# Patient Record
Sex: Female | Born: 1960 | Race: Black or African American | Hispanic: No | Marital: Single | Smoking: Former smoker
Health system: Southern US, Community
[De-identification: ages and names within clinical notes are randomized; demographics above are authoritative.]

## PROBLEM LIST (undated history)

## (undated) HISTORY — PX: POLYPECTOMY: SHX149

## (undated) HISTORY — PX: COLONOSCOPY: SHX174

---

## 1992-01-03 HISTORY — PX: TUBAL LIGATION: SHX77

## 1997-04-29 ENCOUNTER — Encounter: Admission: RE | Admit: 1997-04-29 | Discharge: 1997-04-29 | Payer: Self-pay | Admitting: Family Medicine

## 1997-04-29 ENCOUNTER — Other Ambulatory Visit: Admission: RE | Admit: 1997-04-29 | Discharge: 1997-04-29 | Payer: Self-pay | Admitting: *Deleted

## 1997-12-07 ENCOUNTER — Encounter: Admission: RE | Admit: 1997-12-07 | Discharge: 1997-12-07 | Payer: Self-pay | Admitting: Sports Medicine

## 1997-12-09 ENCOUNTER — Encounter: Payer: Self-pay | Admitting: Family Medicine

## 1997-12-09 ENCOUNTER — Ambulatory Visit (HOSPITAL_COMMUNITY): Admission: RE | Admit: 1997-12-09 | Discharge: 1997-12-09 | Payer: Self-pay | Admitting: Family Medicine

## 1997-12-11 ENCOUNTER — Encounter: Admission: RE | Admit: 1997-12-11 | Discharge: 1997-12-11 | Payer: Self-pay | Admitting: Family Medicine

## 1998-01-06 ENCOUNTER — Encounter: Admission: RE | Admit: 1998-01-06 | Discharge: 1998-01-06 | Payer: Self-pay | Admitting: Family Medicine

## 1998-04-07 ENCOUNTER — Encounter: Admission: RE | Admit: 1998-04-07 | Discharge: 1998-04-07 | Payer: Self-pay | Admitting: Family Medicine

## 1998-04-14 ENCOUNTER — Encounter: Admission: RE | Admit: 1998-04-14 | Discharge: 1998-04-14 | Payer: Self-pay | Admitting: Family Medicine

## 1999-02-07 ENCOUNTER — Encounter: Admission: RE | Admit: 1999-02-07 | Discharge: 1999-02-07 | Payer: Self-pay | Admitting: Family Medicine

## 2000-03-15 ENCOUNTER — Encounter: Admission: RE | Admit: 2000-03-15 | Discharge: 2000-03-15 | Payer: Self-pay | Admitting: Sports Medicine

## 2000-07-02 ENCOUNTER — Encounter (INDEPENDENT_AMBULATORY_CARE_PROVIDER_SITE_OTHER): Payer: Self-pay | Admitting: *Deleted

## 2000-07-02 LAB — CONVERTED CEMR LAB

## 2000-07-31 ENCOUNTER — Encounter: Admission: RE | Admit: 2000-07-31 | Discharge: 2000-07-31 | Payer: Self-pay | Admitting: Family Medicine

## 2000-07-31 ENCOUNTER — Encounter (INDEPENDENT_AMBULATORY_CARE_PROVIDER_SITE_OTHER): Payer: Self-pay | Admitting: *Deleted

## 2000-11-08 ENCOUNTER — Encounter: Admission: RE | Admit: 2000-11-08 | Discharge: 2000-11-08 | Payer: Self-pay | Admitting: Obstetrics

## 2000-12-20 ENCOUNTER — Encounter: Admission: RE | Admit: 2000-12-20 | Discharge: 2000-12-20 | Payer: Self-pay | Admitting: Obstetrics

## 2001-04-16 ENCOUNTER — Encounter: Admission: RE | Admit: 2001-04-16 | Discharge: 2001-04-16 | Payer: Self-pay | Admitting: *Deleted

## 2001-04-25 ENCOUNTER — Ambulatory Visit (HOSPITAL_COMMUNITY): Admission: RE | Admit: 2001-04-25 | Discharge: 2001-04-25 | Payer: Self-pay

## 2001-09-11 ENCOUNTER — Encounter: Admission: RE | Admit: 2001-09-11 | Discharge: 2001-09-11 | Payer: Self-pay | Admitting: Internal Medicine

## 2001-12-10 ENCOUNTER — Encounter: Admission: RE | Admit: 2001-12-10 | Discharge: 2001-12-10 | Payer: Self-pay | Admitting: Internal Medicine

## 2002-12-03 ENCOUNTER — Encounter: Admission: RE | Admit: 2002-12-03 | Discharge: 2002-12-03 | Payer: Self-pay | Admitting: Internal Medicine

## 2004-01-14 ENCOUNTER — Ambulatory Visit: Payer: Self-pay | Admitting: Internal Medicine

## 2004-02-01 ENCOUNTER — Ambulatory Visit (HOSPITAL_COMMUNITY): Admission: RE | Admit: 2004-02-01 | Discharge: 2004-02-01 | Payer: Self-pay | Admitting: *Deleted

## 2004-02-19 ENCOUNTER — Encounter: Admission: RE | Admit: 2004-02-19 | Discharge: 2004-02-19 | Payer: Self-pay | Admitting: Family Medicine

## 2005-10-18 ENCOUNTER — Encounter: Admission: RE | Admit: 2005-10-18 | Discharge: 2005-10-18 | Payer: Self-pay | Admitting: Sports Medicine

## 2006-03-02 ENCOUNTER — Encounter (INDEPENDENT_AMBULATORY_CARE_PROVIDER_SITE_OTHER): Payer: Self-pay | Admitting: *Deleted

## 2006-07-17 ENCOUNTER — Encounter: Admission: RE | Admit: 2006-07-17 | Discharge: 2006-07-17 | Payer: Self-pay | Admitting: Orthopedic Surgery

## 2006-12-13 ENCOUNTER — Encounter: Admission: RE | Admit: 2006-12-13 | Discharge: 2006-12-13 | Payer: Self-pay | Admitting: Internal Medicine

## 2010-01-23 ENCOUNTER — Encounter: Payer: Self-pay | Admitting: *Deleted

## 2010-05-20 NOTE — Consult Note (Signed)
[place]. [facility]  Patient:    Carolyn Crawford, Carolyn Crawford Visit Number: [id number] MRN: [id number]          Service Type: Attending:  Kristen Loader, M.D. Dictated by:   Kristen Loader, M.D.                            Consultation Report  HISTORY OF PRESENT ILLNESS:  This is a 50 year old black female, para 1-0-0-1, in for a routine GYN physical examination.  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  She has had a history of bilateral tubal ligation.  The patient smokes one pack per two to three days.  Social drinker.  Takes no illicit medicines and works as a Industrial/product designer.  ALLERGIES:  No known drug allergies.  PHYSICAL EXAMINATION:  NECK:  Thyroid examination is symmetrical and no masses.  BREASTS:  No dominant masses.  No nipple discharge.  No axillary nodes noted.  ABDOMEN:  Soft, flat, nontender with no masses and no organomegaly.  PELVIC:  Vulva normal, BUS within normal limits, vagina is clean and well rugated.  The cervix is parous and clean.  The uterus is anterior, normal size, shape, consistency.  Adnexa could not be well palpated.  RECTAL:  Normal.  Hemoccult was negative.  Significant physical examination revealed a tenderness in the right sacroiliac area to palpation.  No CVA tenderness on punch tenderness.  IMPRESSION:  This patient is in good physical health with possible sacroiliac strain.  We have recommended a firm mattress or plywood under her mattress and a pillow between her legs.  If this is not helpful, she can get a referral to the orthopedic clinic.  We want a urinalysis, CBC, and the patient is to start baseline mammogram. Dictated by:   Kristen Loader, M.D. Attending:  Kristen Loader, M.D. DD:  04/16/01 TD:  04/16/01 Job: [id number]

## 2011-01-05 ENCOUNTER — Other Ambulatory Visit: Payer: Self-pay | Admitting: Obstetrics and Gynecology

## 2011-01-05 DIAGNOSIS — R928 Other abnormal and inconclusive findings on diagnostic imaging of breast: Secondary | ICD-10-CM

## 2011-01-13 ENCOUNTER — Ambulatory Visit
Admission: RE | Admit: 2011-01-13 | Discharge: 2011-01-13 | Disposition: A | Discharge status: Home / Self Care | Payer: BC Managed Care – PPO | Source: Ambulatory Visit | Attending: Obstetrics and Gynecology | Admitting: Obstetrics and Gynecology

## 2011-01-13 DIAGNOSIS — R928 Other abnormal and inconclusive findings on diagnostic imaging of breast: Secondary | ICD-10-CM

## 2012-02-29 ENCOUNTER — Encounter: Payer: Self-pay | Admitting: Internal Medicine

## 2012-03-07 ENCOUNTER — Ambulatory Visit (AMBULATORY_SURGERY_CENTER): Payer: BC Managed Care – PPO | Admitting: *Deleted

## 2012-03-07 VITALS — Ht 60.0 in | Wt 187.6 lb

## 2012-03-07 DIAGNOSIS — Z1211 Encounter for screening for malignant neoplasm of colon: Secondary | ICD-10-CM

## 2012-03-07 MED ORDER — MOVIPREP 100 G PO SOLR: ORAL | Status: DC

## 2012-03-11 ENCOUNTER — Encounter: Payer: Self-pay | Admitting: Internal Medicine

## 2012-03-19 ENCOUNTER — Ambulatory Visit (AMBULATORY_SURGERY_CENTER): Payer: BC Managed Care – PPO | Admitting: Internal Medicine

## 2012-03-19 ENCOUNTER — Encounter: Payer: Self-pay | Admitting: Internal Medicine

## 2012-03-19 VITALS — BP 153/85 | HR 76 | Temp 96.8°F | Resp 17 | Ht 60.0 in | Wt 187.0 lb

## 2012-03-19 DIAGNOSIS — D126 Benign neoplasm of colon, unspecified: Secondary | ICD-10-CM

## 2012-03-19 DIAGNOSIS — Z1211 Encounter for screening for malignant neoplasm of colon: Secondary | ICD-10-CM

## 2012-03-19 MED ORDER — SODIUM CHLORIDE 0.9 % IV SOLN: 500.0000 mL | INTRAVENOUS | Status: DC

## 2012-03-19 NOTE — Progress Notes (Signed)
Lidocaine-40mg IV prior to Propofol InductionPropofol given over incremental dosages 

## 2012-03-19 NOTE — Progress Notes (Signed)
Called to room to assist during endoscopic procedure.  Patient ID and intended procedure confirmed with present staff. Received instructions for my participation in the procedure from the performing physician.  

## 2012-03-19 NOTE — Patient Instructions (Addendum)
Discharge instructions given with verbal understanding. Handout on polyps given. Resume previous medications. YOU HAD AN ENDOSCOPIC PROCEDURE TODAY AT THE [facility]: Refer to the procedure report that was given to you for any specific questions about what was found during the examination.  If the procedure report does not answer your questions, please call your gastroenterologist to clarify.  If you requested that your care partner not be given the details of your procedure findings, then the procedure report has been included in a sealed envelope for you to review at your convenience later.  YOU SHOULD EXPECT: Some feelings of bloating in the abdomen. Passage of more gas than usual.  Walking can help get rid of the air that was put into your GI tract during the procedure and reduce the bloating. If you had a lower endoscopy (such as a colonoscopy or flexible sigmoidoscopy) you may notice spotting of blood in your stool or on the toilet paper. If you underwent a bowel prep for your procedure, then you may not have a normal bowel movement for a few days.  DIET: Your first meal following the procedure should be a light meal and then it is ok to progress to your normal diet.  A half-sandwich or bowl of soup is an example of a good first meal.  Heavy or fried foods are harder to digest and may make you feel nauseous or bloated.  Likewise meals heavy in dairy and vegetables can cause extra gas to form and this can also increase the bloating.  Drink plenty of fluids but you should avoid alcoholic beverages for 24 hours.  ACTIVITY: Your care partner should take you home directly after the procedure.  You should plan to take it easy, moving slowly for the rest of the day.  You can resume normal activity the day after the procedure however you should NOT DRIVE or use heavy machinery for 24 hours (because of the sedation medicines used during the test).    SYMPTOMS TO REPORT IMMEDIATELY: A  gastroenterologist can be reached at any hour.  During normal business hours, 8:30 AM to 5:00 PM Monday through Friday, call [phone].  After hours and on weekends, please call the GI answering service at [phone] who will take a message and have the physician on call contact you.   Following lower endoscopy (colonoscopy or flexible sigmoidoscopy):  Excessive amounts of blood in the stool  Significant tenderness or worsening of abdominal pains  Swelling of the abdomen that is new, acute  Fever of 100F or higher  FOLLOW UP: If any biopsies were taken you will be contacted by phone or by letter within the next 1-3 weeks.  Call your gastroenterologist if you have not heard about the biopsies in 3 weeks.  Our staff will call the home number listed on your records the next business day following your procedure to check on you and address any questions or concerns that you may have at that time regarding the information given to you following your procedure. This is a courtesy call and so if there is no answer at the home number and we have not heard from you through the emergency physician on call, we will assume that you have returned to your regular daily activities without incident.  SIGNATURES/CONFIDENTIALITY: You and/or your care partner have signed paperwork which will be entered into your electronic medical record.  These signatures attest to the fact that that the information above on your After Visit Summary has   been reviewed and is understood.  Full responsibility of the confidentiality of this discharge information lies with you and/or your care-partner. 

## 2012-03-19 NOTE — Op Note (Signed)
[facility] [address] [place], [address]   COLONOSCOPY PROCEDURE REPORT  PATIENT: Carolyn Crawford, Carolyn Crawford  MR#: [id number] BIRTHDATE: June 12, 1960 , 51  yrs. old GENDER: Female ENDOSCOPIST: Hart Carwin, MD REFERRED BY:  Candice Camp, M.D. PROCEDURE DATE:  03/19/2012 PROCEDURE:   Colonoscopy with cold biopsy polypectomy and Colonoscopy with snare polypectomy ASA CLASS:   Class I INDICATIONS:Average risk patient for colon cancer. MEDICATIONS: MAC sedation, administered by CRNA and propofol (Diprivan) 300mg  IV  DESCRIPTION OF PROCEDURE:   After the risks and benefits and of the procedure were explained, informed consent was obtained.  A digital rectal exam revealed no abnormalities of the rectum.    The LB [id number]  endoscope was introduced through the anus and advanced to the cecum, which was identified by both the appendix and ileocecal valve .  The quality of the prep was good, using MoviPrep .  The instrument was then slowly withdrawn as the colon was fully examined.     COLON FINDINGS: Four smooth sessile polyps ranging between 3-33mm in size were found throughout the entire examined colon., cecum, ascending colon  at 30 cm and at 20 cm  A polypectomy was performed with cold forceps, in the sigmoid colon with a cold snare and using snare cautery.in the right colon  The resection was complete and the polyp tissue was completely retrieved.     Retroflexed views revealed no abnormalities. 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  COMPLICATIONS: There were no complications. ENDOSCOPIC IMPRESSION: Four sessile polyps ranging between 3-70mm in size were found throughout the entire examined colon; polypectomy was performed with cold forceps, with a cold snare and using snare cautery melanosis coli  RECOMMENDATIONS: 1.  Await pathology results 2.  High  fiber diet 3.  stop using herbal laxatives, use fiber, Miralax or stool softeners instead   REPEAT EXAM: for Colonoscopy, pending biopsy results.  cc:  _______________________________ eSignedHart Carwin, MD 03/19/2012 11:17 AM     PATIENT NAME:  [name], [name] MR#: [id number]

## 2012-03-19 NOTE — Progress Notes (Signed)
Patient did not experience any of the following events: a burn prior to discharge; a fall within the facility; wrong site/side/patient/procedure/implant event; or a hospital transfer or hospital admission upon discharge from the facility. (G8907) Patient did not have preoperative order for IV antibiotic SSI prophylaxis. (G8918)  

## 2012-03-20 ENCOUNTER — Telehealth: Payer: Self-pay | Admitting: *Deleted

## 2012-03-20 NOTE — Telephone Encounter (Signed)
  Follow up Call-  Call back number 03/19/2012  Post procedure Call Back phone  # [phone]  Permission to leave phone message Yes     Patient questions:  Do you have a fever, pain , or abdominal swelling? no Pain Score  0 *  Have you tolerated food without any problems? yes  Have you been able to return to your normal activities? yes  Do you have any questions about your discharge instructions: Diet   no Medications  no Follow up visit  no  Do you have questions or concerns about your Care? no  Actions: * If pain score is 4 or above: No action needed, pain <4.

## 2012-03-26 ENCOUNTER — Encounter: Payer: Self-pay | Admitting: Internal Medicine

## 2014-04-28 ENCOUNTER — Other Ambulatory Visit: Payer: Self-pay | Admitting: Obstetrics and Gynecology

## 2014-04-29 LAB — CYTOLOGY - PAP

## 2015-05-11 ENCOUNTER — Other Ambulatory Visit: Payer: Self-pay | Admitting: Obstetrics and Gynecology

## 2015-05-11 DIAGNOSIS — R928 Other abnormal and inconclusive findings on diagnostic imaging of breast: Secondary | ICD-10-CM

## 2015-05-19 ENCOUNTER — Ambulatory Visit
Admission: RE | Admit: 2015-05-19 | Discharge: 2015-05-19 | Disposition: A | Discharge status: Home / Self Care | Payer: BC Managed Care – PPO | Source: Ambulatory Visit | Attending: Obstetrics and Gynecology | Admitting: Obstetrics and Gynecology

## 2015-05-19 DIAGNOSIS — R928 Other abnormal and inconclusive findings on diagnostic imaging of breast: Secondary | ICD-10-CM

## 2015-10-21 ENCOUNTER — Other Ambulatory Visit: Payer: Self-pay | Admitting: Obstetrics and Gynecology

## 2015-10-21 DIAGNOSIS — N63 Unspecified lump in unspecified breast: Secondary | ICD-10-CM

## 2015-11-09 ENCOUNTER — Ambulatory Visit
Admission: RE | Admit: 2015-11-09 | Discharge: 2015-11-09 | Disposition: A | Discharge status: Home / Self Care | Payer: BC Managed Care – PPO | Source: Ambulatory Visit | Attending: Obstetrics and Gynecology | Admitting: Obstetrics and Gynecology

## 2015-11-09 DIAGNOSIS — N63 Unspecified lump in unspecified breast: Secondary | ICD-10-CM

## 2016-03-29 ENCOUNTER — Other Ambulatory Visit: Payer: Self-pay | Admitting: Obstetrics and Gynecology

## 2016-03-29 DIAGNOSIS — N63 Unspecified lump in unspecified breast: Secondary | ICD-10-CM

## 2016-05-10 ENCOUNTER — Other Ambulatory Visit: Payer: Self-pay | Admitting: Obstetrics and Gynecology

## 2016-05-10 ENCOUNTER — Ambulatory Visit
Admission: RE | Admit: 2016-05-10 | Discharge: 2016-05-10 | Disposition: A | Discharge status: Home / Self Care | Payer: BC Managed Care – PPO | Source: Ambulatory Visit | Attending: Obstetrics and Gynecology | Admitting: Obstetrics and Gynecology

## 2016-05-10 DIAGNOSIS — N63 Unspecified lump in unspecified breast: Secondary | ICD-10-CM

## 2017-03-18 ENCOUNTER — Encounter: Payer: Self-pay | Admitting: Gastroenterology

## 2017-03-28 ENCOUNTER — Encounter: Payer: Self-pay | Admitting: Gastroenterology

## 2017-03-28 ENCOUNTER — Other Ambulatory Visit: Payer: Self-pay | Admitting: Obstetrics and Gynecology

## 2017-03-28 DIAGNOSIS — Z1231 Encounter for screening mammogram for malignant neoplasm of breast: Secondary | ICD-10-CM

## 2017-05-15 ENCOUNTER — Ambulatory Visit
Admission: RE | Admit: 2017-05-15 | Discharge: 2017-05-15 | Disposition: A | Discharge status: Home / Self Care | Payer: BC Managed Care – PPO | Source: Ambulatory Visit | Attending: Obstetrics and Gynecology | Admitting: Obstetrics and Gynecology

## 2017-05-15 DIAGNOSIS — Z1231 Encounter for screening mammogram for malignant neoplasm of breast: Secondary | ICD-10-CM

## 2017-05-16 ENCOUNTER — Other Ambulatory Visit: Payer: Self-pay | Admitting: Obstetrics and Gynecology

## 2017-05-16 DIAGNOSIS — R928 Other abnormal and inconclusive findings on diagnostic imaging of breast: Secondary | ICD-10-CM

## 2017-05-23 ENCOUNTER — Ambulatory Visit
Admission: RE | Admit: 2017-05-23 | Discharge: 2017-05-23 | Disposition: A | Discharge status: Home / Self Care | Payer: BC Managed Care – PPO | Source: Ambulatory Visit | Attending: Obstetrics and Gynecology | Admitting: Obstetrics and Gynecology

## 2017-05-23 DIAGNOSIS — R928 Other abnormal and inconclusive findings on diagnostic imaging of breast: Secondary | ICD-10-CM

## 2017-05-29 ENCOUNTER — Ambulatory Visit (AMBULATORY_SURGERY_CENTER): Payer: Self-pay

## 2017-05-29 VITALS — Ht 60.0 in | Wt 190.4 lb

## 2017-05-29 DIAGNOSIS — Z86010 Personal history of colonic polyps: Secondary | ICD-10-CM

## 2017-05-29 MED ORDER — NA SULFATE-K SULFATE-MG SULF 17.5-3.13-1.6 GM/177ML PO SOLN: 1.0000 | Freq: Once | ORAL | 0 refills | Status: AC

## 2017-05-29 NOTE — Progress Notes (Signed)
Per pt, no allergies to soy or egg products.Pt not taking any weight loss meds or using  O2 at home.  Emmi video sent to pt's email. 

## 2017-06-06 ENCOUNTER — Encounter: Payer: Self-pay | Admitting: Gastroenterology

## 2017-06-12 ENCOUNTER — Other Ambulatory Visit: Payer: Self-pay

## 2017-06-12 ENCOUNTER — Ambulatory Visit (AMBULATORY_SURGERY_CENTER): Payer: BC Managed Care – PPO | Admitting: Gastroenterology

## 2017-06-12 ENCOUNTER — Encounter: Payer: Self-pay | Admitting: Gastroenterology

## 2017-06-12 VITALS — BP 118/78 | HR 71 | Temp 98.2°F | Resp 12 | Ht 60.0 in | Wt 190.0 lb

## 2017-06-12 DIAGNOSIS — Z86010 Personal history of colonic polyps: Secondary | ICD-10-CM | POA: Diagnosis not present

## 2017-06-12 DIAGNOSIS — D122 Benign neoplasm of ascending colon: Secondary | ICD-10-CM

## 2017-06-12 DIAGNOSIS — Z1211 Encounter for screening for malignant neoplasm of colon: Secondary | ICD-10-CM | POA: Diagnosis not present

## 2017-06-12 MED ORDER — SODIUM CHLORIDE 0.9 % IV SOLN: 500.0000 mL | Freq: Once | INTRAVENOUS | Status: DC

## 2017-06-12 NOTE — Patient Instructions (Signed)
Please read handouts provided on polyps.  Repeat colonoscopy in 3-5 years based on pathology results.     YOU HAD AN ENDOSCOPIC PROCEDURE TODAY AT [facility]:   Refer to the procedure report that was given to you for any specific questions about what was found during the examination.  If the procedure report does not answer your questions, please call your gastroenterologist to clarify.  If you requested that your care partner not be given the details of your procedure findings, then the procedure report has been included in a sealed envelope for you to review at your convenience later.  YOU SHOULD EXPECT: Some feelings of bloating in the abdomen. Passage of more gas than usual.  Walking can help get rid of the air that was put into your GI tract during the procedure and reduce the bloating. If you had a lower endoscopy (such as a colonoscopy or flexible sigmoidoscopy) you may notice spotting of blood in your stool or on the toilet paper. If you underwent a bowel prep for your procedure, you may not have a normal bowel movement for a few days.  Please Note:  You might notice some irritation and congestion in your nose or some drainage.  This is from the oxygen used during your procedure.  There is no need for concern and it should clear up in a day or so.  SYMPTOMS TO REPORT IMMEDIATELY:   Following lower endoscopy (colonoscopy or flexible sigmoidoscopy):  Excessive amounts of blood in the stool  Significant tenderness or worsening of abdominal pains  Swelling of the abdomen that is new, acute  Fever of 100F or higher    For urgent or emergent issues, a gastroenterologist can be reached at any hour by calling [phone].   DIET:  We do recommend a small meal at first, but then you may proceed to your regular diet.  Drink plenty of fluids but you should avoid alcoholic beverages for 24 hours.  ACTIVITY:  You should plan to take it easy for the rest of today and you  should NOT DRIVE or use heavy machinery until tomorrow (because of the sedation medicines used during the test).    FOLLOW UP: Our staff will call the number listed on your records the next business day following your procedure to check on you and address any questions or concerns that you may have regarding the information given to you following your procedure. If we do not reach you, we will leave a message.  However, if you are feeling well and you are not experiencing any problems, there is no need to return our call.  We will assume that you have returned to your regular daily activities without incident.  If any biopsies were taken you will be contacted by phone or by letter within the next 1-3 weeks.  Please call us at [phone] if you have not heard about the biopsies in 3 weeks.    SIGNATURES/CONFIDENTIALITY: You and/or your care partner have signed paperwork which will be entered into your electronic medical record.  These signatures attest to the fact that that the information above on your After Visit Summary has been reviewed and is understood.  Full responsibility of the confidentiality of this discharge information lies with you and/or your care-partner.

## 2017-06-12 NOTE — Progress Notes (Signed)
Report to PACU, RN, vss, BBS= Clear.  

## 2017-06-12 NOTE — Op Note (Signed)
[place] Patient Name: Carolyn Crawford Procedure Date: 06/12/2017 10:12 AM MRN: [id number] Endoscopist: Mauri Pole , MD Age: 57 Referring MD:  Date of Birth: [birth year] Gender: Female Account #: [id number] Procedure:                Colonoscopy Indications:              High risk colon cancer surveillance: Personal                            history of multiple (3 or more) adenomas, High risk                            colon cancer surveillance: Personal history of                            adenoma less than 10 mm in size Medicines:                Monitored Anesthesia Care Procedure:                Pre-Anesthesia Assessment:                           - Prior to the procedure, a History and Physical                            was performed, and patient medications and                            allergies were reviewed. The patient's tolerance of                            previous anesthesia was also reviewed. The risks                            and benefits of the procedure and the sedation                            options and risks were discussed with the patient.                            All questions were answered, and informed consent                            was obtained. Prior Anticoagulants: The patient has                            taken no previous anticoagulant or antiplatelet                            agents. ASA Grade Assessment: II - A patient with                            mild systemic disease. After reviewing the risks  and benefits, the patient was deemed in                            satisfactory condition to undergo the procedure.                           After obtaining informed consent, the colonoscope                            was passed under direct vision. Throughout the                            procedure, the patient's blood pressure, pulse, and                            oxygen saturations were  monitored continuously. The                            Colonoscope was introduced through the anus and                            advanced to the the cecum, identified by                            appendiceal orifice and ileocecal valve. The                            colonoscopy was performed without difficulty. The                            patient tolerated the procedure well. The quality                            of the bowel preparation was adequate. The                            ileocecal valve, appendiceal orifice, and rectum                            were photographed. Scope In: 10:24:45 AM Scope Out: 10:42:02 AM Scope Withdrawal Time: 0 hours 13 minutes 10 seconds  Total Procedure Duration: 0 hours 17 minutes 17 seconds  Findings:                 The perianal and digital rectal examinations were                            normal.                           A 11 mm polyp was found in the ascending colon. The                            polyp was sessile. The polyp was removed with a  cold snare. Resection and retrieval were complete.                           Non-bleeding internal hemorrhoids were found during                            retroflexion. The hemorrhoids were small.                           The exam was otherwise without abnormality. Complications:            No immediate complications. Estimated Blood Loss:     Estimated blood loss was minimal. Impression:               - One 11 mm polyp in the ascending colon, removed                            with a cold snare. Resected and retrieved.                           - Non-bleeding internal hemorrhoids.                           - The examination was otherwise normal. Recommendation:           - Patient has a contact number available for                            emergencies. The signs and symptoms of potential                            delayed complications were discussed with the                             patient. Return to normal activities tomorrow.                            Written discharge instructions were provided to the                            patient.                           - Resume previous diet.                           - Continue present medications.                           - Await pathology results.                           - Repeat colonoscopy in 3 - 5 years for                            surveillance based on pathology results. Mauri Pole, MD 06/12/2017 10:48:05 AM This report has  been signed electronically.

## 2017-06-12 NOTE — Progress Notes (Signed)
Pt's states no medical or surgical changes since previsit or office visit. 

## 2017-06-12 NOTE — Progress Notes (Signed)
Called to room to assist during endoscopic procedure.  Patient ID and intended procedure confirmed with present staff. Received instructions for my participation in the procedure from the performing physician.  

## 2017-06-13 ENCOUNTER — Telehealth: Payer: Self-pay | Admitting: *Deleted

## 2017-06-13 NOTE — Telephone Encounter (Signed)
No answer for post procedure call back. LEft message and will attempt to call back later this afternoon. SM 

## 2017-06-13 NOTE — Telephone Encounter (Signed)
Left message on f/u call 

## 2017-06-25 ENCOUNTER — Encounter: Payer: Self-pay | Admitting: Gastroenterology

## 2018-07-15 IMAGING — US ULTRASOUND LEFT BREAST LIMITED
1 series · 5 of 5 positions shown · non-contrast
Comparison: 05/15/2017 and prior mammograms

CLINICAL DATA: 56-year-old female for further evaluation of LEFT
breast mass identified on screening mammogram.

EXAM:
ULTRASOUND OF THE LEFT BREAST

[Series 1: ultrasound left breast limited · 0.06mm/px · 5 of 5 slices shown]
[im 1/5]
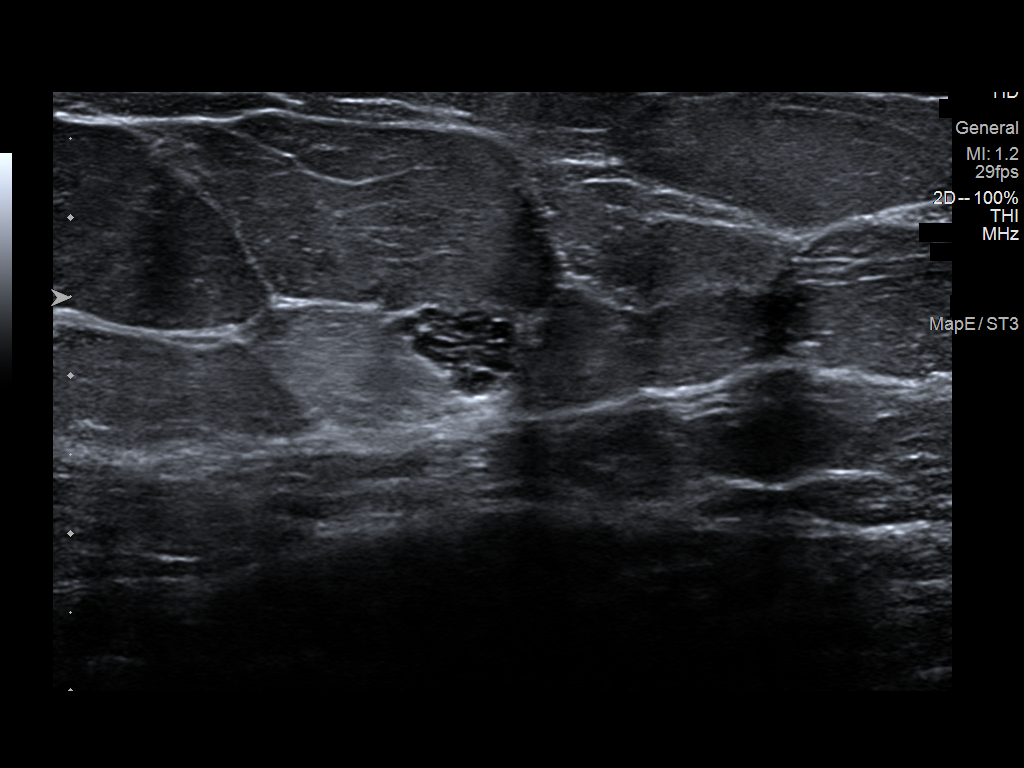
[im 2/5]
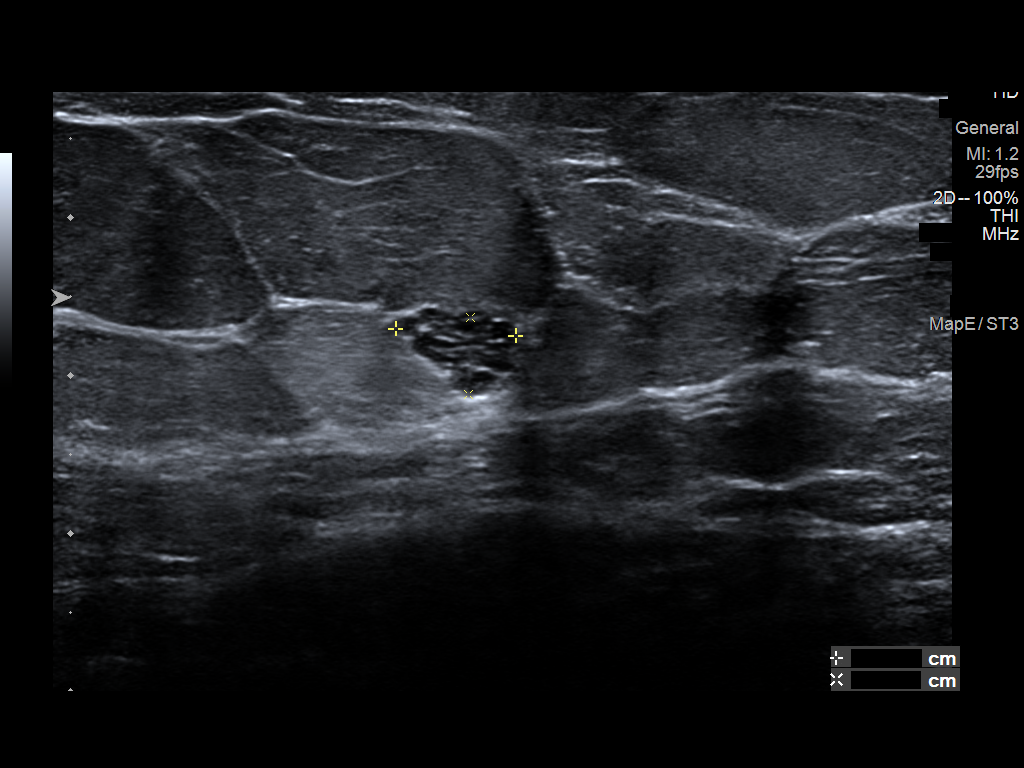
[im 3/5]
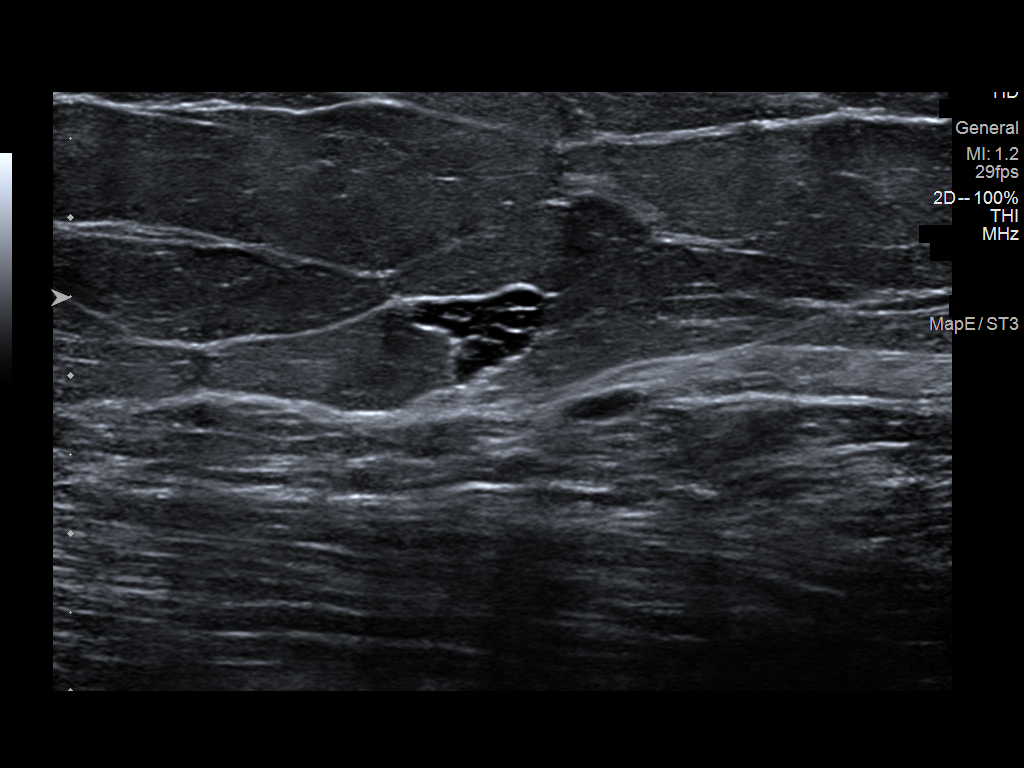
[im 4/5]
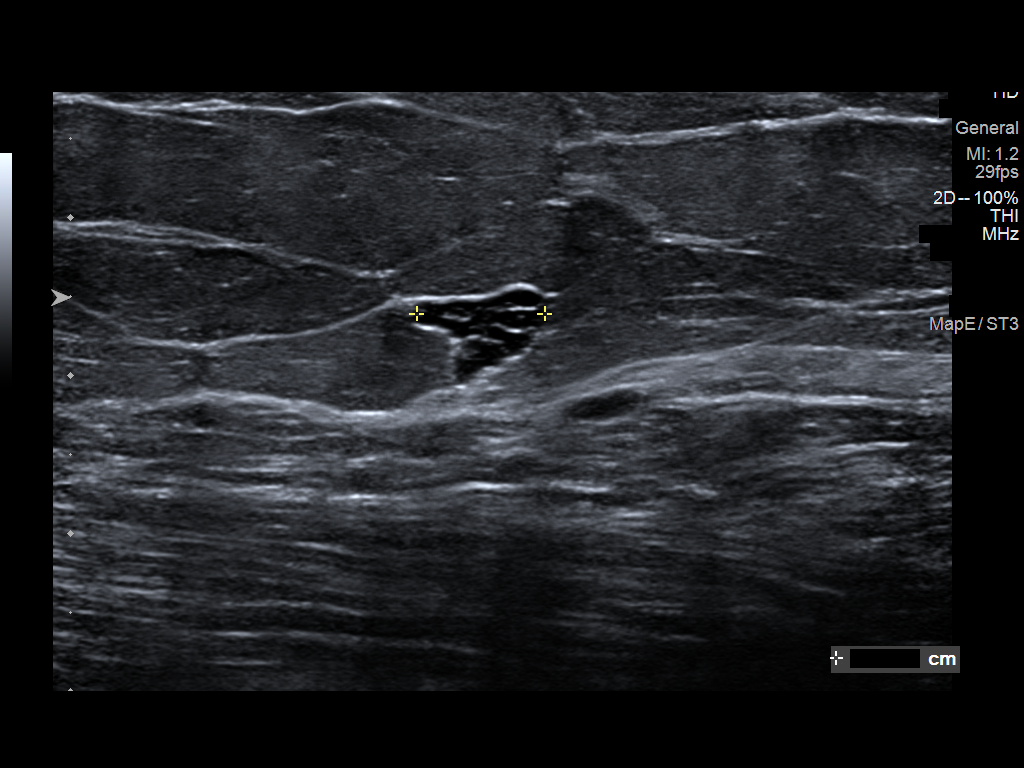
[im 5/5]
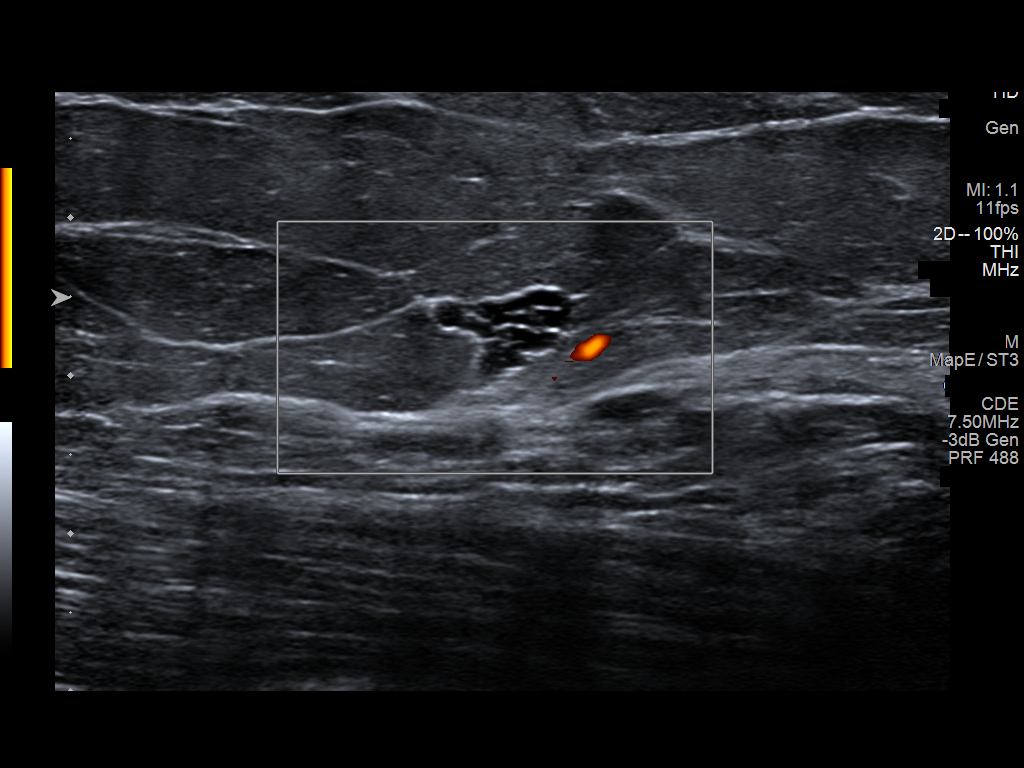

[5 of 5 positions shown; findings below may reference images not displayed]

FINDINGS: Targeted ultrasound is performed, showing an 8 x 5 x 8 mm area of
fibrocystic changes at the 1 o'clock position of the LEFT breast 3
cm from the nipple, corresponding to the mammographic finding.
IMPRESSION: Benign fibrocystic changes within the UPPER-OUTER LEFT breast
corresponding to the screening study finding.

RECOMMENDATION:
Bilateral screening mammograms in 1 year.

I have discussed the findings and recommendations with the patient.
Results were also provided in writing at the conclusion of the
visit. If applicable, a reminder letter will be sent to the patient
regarding the next appointment.

BI-RADS CATEGORY  2: Benign.

## 2020-09-19 ENCOUNTER — Encounter: Payer: Self-pay | Admitting: Gastroenterology

## 2021-02-16 ENCOUNTER — Encounter: Payer: Self-pay | Admitting: Gastroenterology

## 2021-03-08 ENCOUNTER — Ambulatory Visit (AMBULATORY_SURGERY_CENTER): Payer: Self-pay

## 2021-03-08 ENCOUNTER — Other Ambulatory Visit: Payer: Self-pay

## 2021-03-08 VITALS — Ht 59.0 in | Wt 179.0 lb

## 2021-03-08 DIAGNOSIS — Z86010 Personal history of colonic polyps: Secondary | ICD-10-CM

## 2021-03-08 MED ORDER — PLENVU 140 G PO SOLR: 1.0000 | Freq: Once | ORAL | 0 refills | Status: AC

## 2021-03-08 NOTE — Progress Notes (Signed)
Denies allergies to eggs or soy products. Denies complication of anesthesia or sedation. Denies use of weight loss medication. Denies use of O2.   Emmi instructions given for colonoscopy.  

## 2021-03-15 ENCOUNTER — Encounter: Payer: Self-pay | Admitting: Certified Registered Nurse Anesthetist

## 2021-03-21 ENCOUNTER — Encounter: Payer: Self-pay | Admitting: Gastroenterology

## 2021-03-22 ENCOUNTER — Encounter: Payer: Self-pay | Admitting: Gastroenterology

## 2021-03-22 ENCOUNTER — Other Ambulatory Visit: Payer: Self-pay

## 2021-03-22 ENCOUNTER — Ambulatory Visit (AMBULATORY_SURGERY_CENTER): Payer: BC Managed Care – PPO | Admitting: Gastroenterology

## 2021-03-22 VITALS — BP 119/77 | HR 81 | Temp 97.5°F | Resp 16 | Ht 59.0 in | Wt 183.0 lb

## 2021-03-22 DIAGNOSIS — D128 Benign neoplasm of rectum: Secondary | ICD-10-CM

## 2021-03-22 DIAGNOSIS — K635 Polyp of colon: Secondary | ICD-10-CM

## 2021-03-22 DIAGNOSIS — D123 Benign neoplasm of transverse colon: Secondary | ICD-10-CM

## 2021-03-22 DIAGNOSIS — Z860100 Personal history of colon polyps, unspecified: Secondary | ICD-10-CM

## 2021-03-22 DIAGNOSIS — K621 Rectal polyp: Secondary | ICD-10-CM | POA: Diagnosis not present

## 2021-03-22 MED ORDER — SODIUM CHLORIDE 0.9 % IV SOLN: 500.0000 mL | Freq: Once | INTRAVENOUS | Status: DC

## 2021-03-22 NOTE — Progress Notes (Signed)
VS completed by CW.   Pt's states no medical or surgical changes since previsit or office visit.  

## 2021-03-22 NOTE — Patient Instructions (Signed)
Handouts provided on polyps and hemorrhoids.   YOU HAD AN ENDOSCOPIC PROCEDURE TODAY AT THE [facility]:   Refer to the procedure report that was given to you for any specific questions about what was found during the examination.  If the procedure report does not answer your questions, please call your gastroenterologist to clarify.  If you requested that your care partner not be given the details of your procedure findings, then the procedure report has been included in a sealed envelope for you to review at your convenience later.  YOU SHOULD EXPECT: Some feelings of bloating in the abdomen. Passage of more gas than usual.  Walking can help get rid of the air that was put into your GI tract during the procedure and reduce the bloating. If you had a lower endoscopy (such as a colonoscopy or flexible sigmoidoscopy) you may notice spotting of blood in your stool or on the toilet paper. If you underwent a bowel prep for your procedure, you may not have a normal bowel movement for a few days.  Please Note:  You might notice some irritation and congestion in your nose or some drainage.  This is from the oxygen used during your procedure.  There is no need for concern and it should clear up in a day or so.  SYMPTOMS TO REPORT IMMEDIATELY:  Following lower endoscopy (colonoscopy or flexible sigmoidoscopy):  Excessive amounts of blood in the stool  Significant tenderness or worsening of abdominal pains  Swelling of the abdomen that is new, acute  Fever of 100F or higher  For urgent or emergent issues, a gastroenterologist can be reached at any hour by calling [phone]. Do not use MyChart messaging for urgent concerns.    DIET:  We do recommend a small meal at first, but then you may proceed to your regular diet.  Drink plenty of fluids but you should avoid alcoholic beverages for 24 hours.  ACTIVITY:  You should plan to take it easy for the rest of today and you should NOT DRIVE  or use heavy machinery until tomorrow (because of the sedation medicines used during the test).    FOLLOW UP: Our staff will call the number listed on your records 48-72 hours following your procedure to check on you and address any questions or concerns that you may have regarding the information given to you following your procedure. If we do not reach you, we will leave a message.  We will attempt to reach you two times.  During this call, we will ask if you have developed any symptoms of COVID 19. If you develop any symptoms (ie: fever, flu-like symptoms, shortness of breath, cough etc.) before then, please call [phone].  If you test positive for Covid 19 in the 2 weeks post procedure, please call and report this information to us.    If any biopsies were taken you will be contacted by phone or by letter within the next 1-3 weeks.  Please call us at [phone] if you have not heard about the biopsies in 3 weeks.    SIGNATURES/CONFIDENTIALITY: You and/or your care partner have signed paperwork which will be entered into your electronic medical record.  These signatures attest to the fact that that the information above on your After Visit Summary has been reviewed and is understood.  Full responsibility of the confidentiality of this discharge information lies with you and/or your care-partner.  

## 2021-03-22 NOTE — Op Note (Signed)
[place] ?Patient Name: Carolyn Crawford ?Procedure Date: 03/22/2021 3:09 PM ?MRN: [id number] ?Endoscopist: Mauri Pole , MD ?Age: 61 ?Referring MD:  ?Date of Birth: [birth year] ?Gender: Female ?Account #: [id number] ?Procedure:                Colonoscopy ?Indications:              High risk colon cancer surveillance: Personal  ?                          history of colonic polyps, High risk colon cancer  ?                          surveillance: Personal history of sessile serrated  ?                          colon polyp (10 mm or greater in size) ?Medicines:                Monitored Anesthesia Care ?Procedure:                Pre-Anesthesia Assessment: ?                          - Prior to the procedure, a History and Physical  ?                          was performed, and patient medications and  ?                          allergies were reviewed. The patient's tolerance of  ?                          previous anesthesia was also reviewed. The risks  ?                          and benefits of the procedure and the sedation  ?                          options and risks were discussed with the patient.  ?                          All questions were answered, and informed consent  ?                          was obtained. Prior Anticoagulants: The patient has  ?                          taken no previous anticoagulant or antiplatelet  ?                          agents. ASA Grade Assessment: I - A normal, healthy  ?                          patient. After reviewing the risks and benefits,  ?  the patient was deemed in satisfactory condition to  ?                          undergo the procedure. ?                          After obtaining informed consent, the colonoscope  ?                          was passed under direct vision. Throughout the  ?                          procedure, the patient's blood pressure, pulse, and  ?                          oxygen saturations were  monitored continuously. The  ?                          [place] #[id number] was introduced through  ?                          the anus and advanced to the the cecum, identified  ?                          by appendiceal orifice and ileocecal valve. The  ?                          colonoscopy was performed without difficulty. The  ?                          patient tolerated the procedure well. The quality  ?                          of the bowel preparation was excellent. The  ?                          ileocecal valve, appendiceal orifice, and rectum  ?                          were photographed. ?Scope In: 3:12:37 PM ?Scope Out: 3:31:34 PM ?Scope Withdrawal Time: 0 hours 14 minutes 21 seconds  ?Total Procedure Duration: 0 hours 18 minutes 57 seconds  ?Findings:                 The perianal and digital rectal examinations were  ?                          normal. ?                          Three sessile polyps were found in the rectum and  ?                          transverse colon. The polyps were 5 to 8 mm in  ?  size. These polyps were removed with a cold snare.  ?                          Resection and retrieval were complete. ?                          Two sessile polyps were found in the rectum. The  ?                          polyps were 1 to 2 mm in size. These polyps were  ?                          removed with a cold biopsy forceps. Resection and  ?                          retrieval were complete. ?                          Non-bleeding external and internal hemorrhoids were  ?                          found during retroflexion. The hemorrhoids were  ?                          medium-sized. ?Complications:            No immediate complications. ?Estimated Blood Loss:     Estimated blood loss was minimal. ?Impression:               - Three 5 to 8 mm polyps in the rectum and in the  ?                          transverse colon, removed with a cold snare.  ?                           Resected and retrieved. ?                          - Two 1 to 2 mm polyps in the rectum, removed with  ?                          a cold biopsy forceps. Resected and retrieved. ?                          - Non-bleeding external and internal hemorrhoids. ?Recommendation:           - Patient has a contact number available for  ?                          emergencies. The signs and symptoms of potential  ?                          delayed complications were discussed with the  ?  patient. Return to normal activities tomorrow.  ?                          Written discharge instructions were provided to the  ?                          patient. ?                          - Resume previous diet. ?                          - Continue present medications. ?                          - Await pathology results. ?                          - Repeat colonoscopy in 3 - 5 years for  ?                          surveillance based on pathology results. ?Mauri Pole, MD ?03/22/2021 3:39:13 PM ?This report has been signed electronically. ?

## 2021-03-22 NOTE — Progress Notes (Signed)
[facility] History and Physical ? ? ?Primary Care Physician:  Louretta Shorten, MD ? ? ?Reason for Procedure:  History of adenomatous colon polyps ? ?Plan:    Surveillance colonoscopy with possible interventions as needed ? ? ? ? ?HPI: Carolyn Crawford is a very pleasant 61 y.o. female here for surveillance colonoscopy. ?Denies any nausea, vomiting, abdominal pain, melena or bright red blood per rectum ? ?The risks and benefits as well as alternatives of endoscopic procedure(s) have been discussed and reviewed. All questions answered. The patient agrees to proceed. ? ? ? ?History reviewed. No pertinent past medical history. ? ?Past Surgical History:  ?Procedure Laterality Date  ? [place]  ?  1 time  ? COLONOSCOPY    ? POLYPECTOMY    ? TUBAL LIGATION  1994  ? ? ?Prior to Admission medications   ?Medication Sig Start Date End Date Taking? Authorizing Provider  ?Calcium Citrate-Vitamin D (CALCIUM + D PO) Take by mouth. Calcium,Vit D and Magnesium-Take 2 a day   Yes [provider]  ?ibuprofen (ADVIL,MOTRIN) 200 MG tablet Take 200 mg by mouth every 6 (six) hours as needed for pain.   Yes [provider]  ?OVER THE COUNTER MEDICATION Fish Oil, one capsule daily.   Yes [provider]  ?OVER THE COUNTER MEDICATION Vitamin D 3 one capsule daily.   Yes [provider]  ?OVER THE COUNTER MEDICATION Vitamin C, one tablet daily.   Yes [provider]  ? ? ?Current Outpatient Medications  ?Medication Sig Dispense Refill  ? Calcium Citrate-Vitamin D (CALCIUM + D PO) Take by mouth. Calcium,Vit D and Magnesium-Take 2 a day    ? ibuprofen (ADVIL,MOTRIN) 200 MG tablet Take 200 mg by mouth every 6 (six) hours as needed for pain.    ? OVER THE COUNTER MEDICATION Fish Oil, one capsule daily.    ? OVER THE COUNTER MEDICATION Vitamin D 3 one capsule daily.    ? OVER THE COUNTER MEDICATION Vitamin C, one tablet daily.    ? ?Current Facility-Administered Medications   ?Medication Dose Route Frequency Provider Last Rate Last Admin  ? 0.9 %  sodium chloride infusion  500 mL Intravenous Once Tennis Mckinnon V, MD      ? 0.9 %  sodium chloride infusion  500 mL Intravenous Once Christianjames Soule, Venia Minks, MD      ? ? ?Allergies as of 03/22/2021  ? (No Known Allergies)  ? ? ?Family History  ?Problem Relation Age of Onset  ? SIDS Brother   ? Colon cancer Neg Hx   ? Esophageal cancer Neg Hx   ? Stomach cancer Neg Hx   ? Rectal cancer Neg Hx   ? ? ?Social History  ? ?Socioeconomic History  ? Marital status: Single  ?  Spouse name: Not on file  ? Number of children: Not on file  ? Years of education: Not on file  ? Highest education level: Not on file  ?Occupational History  ? Not on file  ?Tobacco Use  ? Smoking status: Former  ?  Types: Cigarettes  ?  Quit date: 03/07/2000  ?  Years since quitting: 21.0  ? Smokeless tobacco: Never  ?Vaping Use  ? Vaping Use: Never used  ?Substance and Sexual Activity  ? Alcohol use: No  ? Drug use: No  ? Sexual activity: Not on file  ?Other Topics Concern  ? Not on file  ?Social History Narrative  ? Not on file  ? ?Social  Determinants of Health  ? ?Financial Resource Strain: Not on file  ?Food Insecurity: Not on file  ?Transportation Needs: Not on file  ?Physical Activity: Not on file  ?Stress: Not on file  ?Social Connections: Not on file  ?Intimate Partner Violence: Not on file  ? ? ?Review of Systems: ? ?All other review of systems negative except as mentioned in the HPI. ? ?Physical Exam: ?Vital signs in last 24 hours: ?BP 128/71   Pulse 81   Temp (!) 97.5 ?F (36.4 ?C) (Temporal)   Ht '4\' 11"'$  (1.499 m)   Wt 183 lb (83 kg)   LMP 03/01/2012   SpO2 96%   BMI 36.96 kg/m?  ?General:   Alert, NAD ?Lungs:  Clear .   ?Heart:  Regular rate and rhythm ?Abdomen:  Soft, nontender and nondistended. ?Neuro/Psych:  Alert and cooperative. Normal mood and affect. A and O x 3 ? ?Reviewed labs, radiology imaging, old records and pertinent past GI work up ? ?Patient is  appropriate for planned procedure(s) and anesthesia in an ambulatory setting ? ? ?K. Denzil Magnuson , MD ?[phone]  ? ? ?  ?

## 2021-03-22 NOTE — Progress Notes (Signed)
1510 HR > 100 with esmolol 25 mg given IV, MD updated, vss 

## 2021-03-22 NOTE — Progress Notes (Signed)
1525 Robinul 0.1 mg IV given due large amount of secretions upon assessment.  HR > 100 with esmolol 25 mg given IV, MD updated, vss  ?

## 2021-03-23 ENCOUNTER — Encounter: Payer: Self-pay | Admitting: Gastroenterology

## 2021-03-24 ENCOUNTER — Telehealth: Payer: Self-pay

## 2021-03-24 NOTE — Telephone Encounter (Signed)
?  Follow up Call- ? ? ?  03/22/2021  ?  2:20 PM  ?Call back number  ?Post procedure Call Back phone  # [phone]  ?Permission to leave phone message Yes  ?  ? ?Patient questions: ? ?Do you have a fever, pain , or abdominal swelling? No. ?Pain Score  0 * ? ?Have you tolerated food without any problems? Yes.   ? ?Have you been able to return to your normal activities? Yes.   ? ?Do you have any questions about your discharge instructions: ?Diet   No. ?Medications  No. ?Follow up visit  No. ? ?Do you have questions or concerns about your Care? No. ? ?Actions: ?* If pain score is 4 or above: ?No action needed, pain <4. ? ? ?

## 2021-03-31 ENCOUNTER — Encounter: Payer: Self-pay | Admitting: Gastroenterology
